# Patient Record
Sex: Male | Born: 1960 | Race: White | Hispanic: No | Smoking: Current every day smoker
Health system: Southern US, Community
[De-identification: ages and names within clinical notes are randomized; demographics above are authoritative.]

## PROBLEM LIST (undated history)

## (undated) DIAGNOSIS — F419 Anxiety disorder, unspecified: Secondary | ICD-10-CM

## (undated) DIAGNOSIS — F4310 Post-traumatic stress disorder, unspecified: Secondary | ICD-10-CM

## (undated) DIAGNOSIS — H9319 Tinnitus, unspecified ear: Secondary | ICD-10-CM

## (undated) DIAGNOSIS — I10 Essential (primary) hypertension: Secondary | ICD-10-CM

## (undated) DIAGNOSIS — F329 Major depressive disorder, single episode, unspecified: Secondary | ICD-10-CM

## (undated) DIAGNOSIS — R002 Palpitations: Secondary | ICD-10-CM

## (undated) DIAGNOSIS — J449 Chronic obstructive pulmonary disease, unspecified: Secondary | ICD-10-CM

## (undated) DIAGNOSIS — I341 Nonrheumatic mitral (valve) prolapse: Secondary | ICD-10-CM

## (undated) DIAGNOSIS — F32A Depression, unspecified: Secondary | ICD-10-CM

## (undated) DIAGNOSIS — F1910 Other psychoactive substance abuse, uncomplicated: Secondary | ICD-10-CM

## (undated) DIAGNOSIS — F319 Bipolar disorder, unspecified: Secondary | ICD-10-CM

## (undated) HISTORY — DX: Major depressive disorder, single episode, unspecified: F32.9

## (undated) HISTORY — DX: Anxiety disorder, unspecified: F41.9

## (undated) HISTORY — DX: Palpitations: R00.2

## (undated) HISTORY — DX: Essential (primary) hypertension: I10

## (undated) HISTORY — DX: Other psychoactive substance abuse, uncomplicated: F19.10

## (undated) HISTORY — DX: Bipolar disorder, unspecified: F31.9

## (undated) HISTORY — DX: Post-traumatic stress disorder, unspecified: F43.10

## (undated) HISTORY — PX: SALIVARY GLAND SURGERY: SHX768

## (undated) HISTORY — DX: Chronic obstructive pulmonary disease, unspecified: J44.9

## (undated) HISTORY — DX: Depression, unspecified: F32.A

## (undated) HISTORY — DX: Tinnitus, unspecified ear: H93.19

## (undated) HISTORY — DX: Nonrheumatic mitral (valve) prolapse: I34.1

## (undated) HISTORY — PX: OTHER SURGICAL HISTORY: SHX169

---

## 1999-09-27 ENCOUNTER — Emergency Department (HOSPITAL_COMMUNITY): Admission: EM | Admit: 1999-09-27 | Discharge: 1999-09-27 | Payer: Self-pay | Admitting: Emergency Medicine

## 1999-09-27 ENCOUNTER — Encounter: Payer: Self-pay | Admitting: Emergency Medicine

## 2003-10-06 DIAGNOSIS — I341 Nonrheumatic mitral (valve) prolapse: Secondary | ICD-10-CM

## 2003-10-06 HISTORY — DX: Nonrheumatic mitral (valve) prolapse: I34.1

## 2007-04-25 ENCOUNTER — Emergency Department (HOSPITAL_COMMUNITY): Admission: EM | Admit: 2007-04-25 | Discharge: 2007-04-26 | Payer: Self-pay | Admitting: Emergency Medicine

## 2007-04-26 ENCOUNTER — Inpatient Hospital Stay (HOSPITAL_COMMUNITY): Admission: AD | Admit: 2007-04-26 | Discharge: 2007-04-28 | Payer: Self-pay | Admitting: *Deleted

## 2007-04-26 ENCOUNTER — Ambulatory Visit: Payer: Self-pay | Admitting: *Deleted

## 2011-02-17 NOTE — Discharge Summary (Signed)
NAMEHUMBERT, Lucas Lawrence NO.:  [id number]   MEDICAL RECORD NO.:  [id number]          PATIENT TYPE:  IPS   LOCATION:  0304                          FACILITY:  [facility]   PHYSICIAN:  Jasmine Pang, M.D. DATE OF BIRTH:  1960/10/26   DATE OF ADMISSION:  04/26/2007  DATE OF DISCHARGE:  04/28/2007                               DISCHARGE SUMMARY   IDENTIFYING INFORMATION:  This is a 50 year old white, married male who  is currently separated from his wife.  He was admitted on a voluntary  basis on April 25, 2007.   HISTORY OF PRESENT ILLNESS:  The patient relapsed on alcohol after about  6 months abstinence.  He relapsed 2 weeks ago.  He is now drinking  vodka, approximately one-fifth a day.  He states he is needing to drink  to stay calm.  He is now wanting detox.  He and his wife separated 1  week ago secondary to his alcohol use, so he plans to live with his  sister.  This is the first inpatient admission for this patient.  He is  in no outpatient treatment.  He did have a DWI 7 or 8 years ago and got  some counseling then.  He has a family history of substance dependence,  especially in father and paternal grandfather.  He has hypertension.  He  is currently on Xanax 0.5 mg p.o. b.i.d.  He has no known drug  allergies.   PHYSICAL FINDINGS:  A complete physical exam was done in the ED prior to  admission.  This was within normal limits.  He was in no acute physical  or medical distress.   ADMISSION LABORATORIES:  UDS:  Positive for benzodiazepines.  Potassium  slightly low at 3.3.  Alcohol level 254.  The rest of the labs were done  at the ED prior to admission and reviewed by the ED physician.   HOSPITAL COURSE:  Upon admission, the patient was started on Librium  detox protocol.  He was also started on trazodone 50 mg p.o. q.h.s.  p.r.n. insomnia, may repeat in 1 hour if needed.  He was continued on  his home medication of BuSpar 15 mg one-half tablet p.o. b.i.d.,  Zoloft  100 mg p.o. b.i.d., atenolol 100 mg p.o. b.i.d., lovastatin 40 mg p.o.  q.h.s.  On April 26, 2007, the BuSpar was discontinued as he did not feel  this had been helpful.  The patient tolerated these medications well  with no significant side effects.  He tolerated the Librium detox  protocol without symptoms of withdrawal.   The patient was friendly and cooperative.  He stated I am an  alcoholic.  Though he stated initially he wanted treatment for this, he  later decided he wanted an outpatient rehab treatment.  He states he  began to drink vodka to stay calm.  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  He was drinking some during the day.  He has a  history of a DWI.  He has been at [facility] in the past.  He participated  appropriately in unit  therapeutic groups and activities.  His mood and  affect began to improve.  He was anxious about being in the hospital and  wanted to go home as soon as he could.  As indicated above, he has  decided he did not want followup with a psychiatrist or counselor or any  drug treatment followup.  He planned to go to his church and be  counseled by his pastor.  He also talked about a program the church had  for which there were meetings every week for chemically dependent people  that he was going to attend.   On April 28, 2007, the patient's mental status had improved markedly from  admission status.  He was friendly and cooperative with good eye  contact.  Speech was normal rate and flow.  Psychomotor activity was  within normal limits.  The mood was euthymic.  Affect was wide range.  There was no suicidal or homicidal ideation.  No thoughts of self-  injurious behavior.  No paranoia or delusions.  Thoughts were logical  and goal-directed.  Thought content with no predominant theme.  Cognitive was grossly back to baseline, which was within normal limits.  It was felt the patient could safely be discharged home today.   DISCHARGE  DIAGNOSES:  AXIS I:  Alcohol dependence.  Rule out depressive disorder, not otherwise specified.  AXIS II:  None.  AXIS III:  Hypertension.  AXIS IV:  Severe (issues with marital separation, problems with social  environment, housing problem, burden of chemical dependence history).  AXIS V:  Global assessment of functioning upon discharge is 50.  Global  assessment of functioning upon admission was 35.  Global assessment of  functioning highest past year was 70.   DISCHARGE PLANS:  There were no specific activity level or dietary  restrictions.   POST HOSPITAL CARE PLANS:  The patient refuses followup and resources  for any post hospital treatment.   DISCHARGE MEDICATIONS:  1. Tenormin 100 mg b.i.d.  2. Lovastatin 40 mg daily.  3. Zoloft 100 mg twice daily.  4. Trazodone 50 mg at bedtime if needed for sleep, may repeat dose x1      if needed.      Jasmine Pang, M.D.  Electronically Signed     [facility]  D:  04/28/2007  T:  04/29/2007  Job:  [id number]

## 2011-07-20 LAB — BASIC METABOLIC PANEL
Chloride: 98
GFR calc Af Amer: >60
GFR calc non Af Amer: >60
Potassium: 3.3 — ABNORMAL LOW

## 2011-07-20 LAB — RAPID URINE DRUG SCREEN, HOSP PERFORMED
Amphetamines: NOT DETECTED
Barbiturates: NOT DETECTED
Benzodiazepines: POSITIVE — AB
Opiates: NOT DETECTED

## 2011-07-20 LAB — HEPATIC FUNCTION PANEL
AST: 49 — ABNORMAL HIGH
Alkaline Phosphatase: 95
Bilirubin, Direct: 0.2
Total Bilirubin: 1.4 — ABNORMAL HIGH

## 2011-07-20 LAB — ETHANOL: Alcohol, Ethyl (B): 254 — ABNORMAL HIGH

## 2011-07-20 LAB — TSH: TSH: 3.144

## 2012-10-17 ENCOUNTER — Emergency Department (HOSPITAL_COMMUNITY)
Admission: EM | Admit: 2012-10-17 | Discharge: 2012-10-17 | Disposition: A | Discharge status: Home / Self Care | Payer: Self-pay | Attending: Emergency Medicine | Admitting: Emergency Medicine

## 2012-10-17 ENCOUNTER — Encounter (HOSPITAL_COMMUNITY): Payer: Self-pay | Admitting: *Deleted

## 2012-10-17 ENCOUNTER — Emergency Department (HOSPITAL_COMMUNITY): Payer: Self-pay

## 2012-10-17 DIAGNOSIS — F17200 Nicotine dependence, unspecified, uncomplicated: Secondary | ICD-10-CM | POA: Insufficient documentation

## 2012-10-17 DIAGNOSIS — Z7982 Long term (current) use of aspirin: Secondary | ICD-10-CM | POA: Insufficient documentation

## 2012-10-17 DIAGNOSIS — Y9301 Activity, walking, marching and hiking: Secondary | ICD-10-CM | POA: Insufficient documentation

## 2012-10-17 DIAGNOSIS — T148XXA Other injury of unspecified body region, initial encounter: Secondary | ICD-10-CM

## 2012-10-17 DIAGNOSIS — I10 Essential (primary) hypertension: Secondary | ICD-10-CM | POA: Insufficient documentation

## 2012-10-17 DIAGNOSIS — Z8679 Personal history of other diseases of the circulatory system: Secondary | ICD-10-CM | POA: Insufficient documentation

## 2012-10-17 DIAGNOSIS — Z79899 Other long term (current) drug therapy: Secondary | ICD-10-CM | POA: Insufficient documentation

## 2012-10-17 DIAGNOSIS — Y929 Unspecified place or not applicable: Secondary | ICD-10-CM | POA: Insufficient documentation

## 2012-10-17 DIAGNOSIS — X500XXA Overexertion from strenuous movement or load, initial encounter: Secondary | ICD-10-CM | POA: Insufficient documentation

## 2012-10-17 DIAGNOSIS — IMO0002 Reserved for concepts with insufficient information to code with codable children: Secondary | ICD-10-CM | POA: Insufficient documentation

## 2012-10-17 HISTORY — DX: Essential (primary) hypertension: I10

## 2012-10-17 HISTORY — DX: Nonrheumatic mitral (valve) prolapse: I34.1

## 2012-10-17 MED ORDER — IBUPROFEN 800 MG PO TABS: 800.0000 mg | ORAL_TABLET | Freq: Three times a day (TID) | ORAL | Status: AC | PRN

## 2012-10-17 MED ORDER — OXYCODONE-ACETAMINOPHEN 5-325 MG PO TABS: 1.0000 | ORAL_TABLET | Freq: Four times a day (QID) | ORAL | Status: AC | PRN

## 2012-10-17 NOTE — ED Notes (Signed)
Pt is here with left mid shin pain that started after he turned and felt something pop in his left shin.  Pt cannot put weight on it.  Pulse present.  Some mid shin swelling

## 2012-10-17 NOTE — ED Provider Notes (Signed)
History     CSN: [id number]  Arrival date & time 10/17/12  0719   First MD Initiated Contact with Patient 10/17/12 [phone]      Chief Complaint  Patient presents with  . Leg Pain    (Consider location/radiation/quality/duration/timing/severity/associated sxs/prior treatment) HPI Pt reports several days of aching pain in L ankle, yesterday when he was turning to walk, he felt a pop in his anterior shin associated with severe pain, unable to bear weight due to pain. No knee pain. No previous history of same.   Past Medical History  Diagnosis Date  . Hypertension   . Mitral valve prolapse     Past Surgical History  Procedure Date  . Neck surgery gland removal     No family history on file.  History  Substance Use Topics  . Smoking status: Current Every Day Smoker  . Smokeless tobacco: Not on file  . Alcohol Use: Yes     Comment: occ      Review of Systems All other systems reviewed and are negative except as noted in HPI.   Allergies  Review of patient's allergies indicates no known allergies.  Home Medications   Current Outpatient Rx  Name  Route  Sig  Dispense  Refill  . ASPIRIN EC 81 MG PO TBEC   Oral   Take 81 mg by mouth daily.         . ATENOLOL 50 MG PO TABS   Oral   Take 50 mg by mouth 2 (two) times daily.         Marland Kitchen CLONAZEPAM 1 MG PO TABS   Oral   Take 1 mg by mouth 3 (three) times daily.         . IBUPROFEN 200 MG PO TABS   Oral   Take 800 mg by mouth every 6 (six) hours as needed. For pain         . ADULT MULTIVITAMIN W/MINERALS CH   Oral   Take 1 tablet by mouth at bedtime.           BP 146/76  Pulse 58  Temp 98.3 F (36.8 C) (Oral)  Resp 16  SpO2 98%  Physical Exam  Constitutional: He is oriented to person, place, and time. He appears well-developed and well-nourished.  HENT:  Head: Normocephalic and atraumatic.  Neck: Neck supple.  Pulmonary/Chest: Effort normal.  Musculoskeletal: Normal range of motion. He  exhibits tenderness (mild L anterior shin, no calf pain/tenderness, no cords). He exhibits no edema.  Neurological: He is alert and oriented to person, place, and time. No cranial nerve deficit.  Psychiatric: He has a normal mood and affect. His behavior is normal.    ED Course  Procedures (including critical care time)  Labs Reviewed - No data to display Dg Tibia/fibula Left  10/17/2012  *RADIOLOGY REPORT*  Clinical Data: Felt "pop" in lower leg.  Unable to bear weight.  LEFT TIBIA AND FIBULA - 2 VIEW  Comparison: None.  Findings: There may be mild soft tissue swelling about the ankle joint.  No definite fracture.  Tibia and fibula are intact.  IMPRESSION: Possible mild soft tissue swelling about the ankle joint.  No acute osseous abnormality.   Original Report Authenticated By: Leanna Battles, M.D.      No diagnosis found.    MDM  Likely soft tissue injury to anterior shin, xray as above neg for fracture. Cam walker, crutches, pain meds and PCP followup.  Cordell B. Bernette Mayers, MD 10/17/12 [phone]

## 2012-10-17 NOTE — Progress Notes (Signed)
Orthopedic Tech Progress Note Patient Details:  Lucas Lawrence [birth year] [id number] CAM walker applied to Left LE with instructions. Crutches fitted for height and comfort.  Ortho Devices Type of Ortho Device: CAM walker;Crutches Ortho Device/Splint Location: Left LE Ortho Device/Splint Interventions: Application   Asia R Thompson 10/17/2012, 8:53 AM

## 2016-01-09 DIAGNOSIS — I10 Essential (primary) hypertension: Secondary | ICD-10-CM

## 2016-01-15 ENCOUNTER — Encounter: Payer: Self-pay | Admitting: Physician Assistant

## 2016-01-15 ENCOUNTER — Ambulatory Visit: Payer: Self-pay | Admitting: Physician Assistant

## 2016-01-15 VITALS — BP 104/62 | HR 57 | Temp 97.9°F | Ht 68.0 in | Wt 175.4 lb

## 2016-01-15 DIAGNOSIS — F17219 Nicotine dependence, cigarettes, with unspecified nicotine-induced disorders: Secondary | ICD-10-CM | POA: Insufficient documentation

## 2016-01-15 DIAGNOSIS — F1011 Alcohol abuse, in remission: Secondary | ICD-10-CM | POA: Insufficient documentation

## 2016-01-15 DIAGNOSIS — M2550 Pain in unspecified joint: Secondary | ICD-10-CM

## 2016-01-15 DIAGNOSIS — R351 Nocturia: Secondary | ICD-10-CM

## 2016-01-15 DIAGNOSIS — F3160 Bipolar disorder, current episode mixed, unspecified: Secondary | ICD-10-CM | POA: Insufficient documentation

## 2016-01-15 DIAGNOSIS — Z131 Encounter for screening for diabetes mellitus: Secondary | ICD-10-CM

## 2016-01-15 DIAGNOSIS — Z125 Encounter for screening for malignant neoplasm of prostate: Secondary | ICD-10-CM

## 2016-01-15 DIAGNOSIS — Z13220 Encounter for screening for lipoid disorders: Secondary | ICD-10-CM

## 2016-01-15 LAB — POCT URINALYSIS DIPSTICK
Bilirubin, UA: NEGATIVE
Glucose, UA: NEGATIVE
Ketones, UA: NEGATIVE
Leukocytes, UA: NEGATIVE
Nitrite, UA: NEGATIVE
PROTEIN UA: NEGATIVE
RBC UA: NEGATIVE
SPEC GRAV UA: 1.025
UROBILINOGEN UA: 0.2
pH, UA: 5.5

## 2016-01-15 LAB — GLUCOSE, POCT (MANUAL RESULT ENTRY): POC Glucose: 110 mg/dl — AB (ref 70–99)

## 2016-01-15 NOTE — Progress Notes (Signed)
BP 104/62 mmHg  Pulse 57  Temp(Src) 97.9 F (36.6 C)  Ht  (1.727 m)  Wt 175 lb 6.4 oz (79.561 kg)  BMI 26.68 kg/m2  SpO2 95%   Subjective:    Patient ID: Lucas Lawrence, male    DOB: 1961/08/20, 55 y.o.   MRN: [id number]  HPI: Lucas Lawrence is a 55 y.o. male presenting on 01/15/2016 for New Patient (Initial Visit); Shoulder Pain; Elbow Pain; Edema; Urinary Frequency; and Numbness   HPI   Chief Complaint  Patient presents with  . New Patient (Initial Visit)  . Shoulder Pain    R shoulder pain when raising R arm. denies injury  . Elbow Pain    L elbow. denies injury  . Edema    R calf down to ankle and sometimes R foot  . Urinary Frequency    pt states he wakes up every 2 hours to urinate during the night for about 2 months now  . Numbness    on 4th and 5th fingers on the R hand down to his wrist     Pt just moved here from [place] about one month ago.   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   Pt alcoholic. Dry since march.  No counseling at present  Pt went to [facility].  He isn't goin back b/c he didn't like who he saw.  Pt taking azo b/c of nocturia. This started within past month.  Pt states he has not had an injury, remote or recent, he just has lots of aches and pain.  Pt states he had colonoscopy about 1 1/2 yr ago  Relevant past medical, surgical, family and social history reviewed and updated as indicated. Interim medical history since our last visit reviewed. Allergies and medications reviewed and updated.   Current outpatient prescriptions:  .  albuterol (PROVENTIL HFA) 108 (90 Base) MCG/ACT inhaler, Inhale 2 puffs into the lungs every 4 (four) hours as needed for wheezing or shortness of breath., Disp: , Rfl:  .  aspirin 81 MG tablet, Take 81 mg by mouth daily., Disp: , Rfl:  .  gabapentin (NEURONTIN) 400 MG capsule, Take 400 mg by mouth. Take 1 cap every morning, 1 in the afternoon, and 2 each night, Disp: , Rfl:  .   ibuprofen (ADVIL,MOTRIN) 200 MG tablet, Take 800 mg by mouth as needed., Disp: , Rfl:  .  metoprolol (LOPRESSOR) 50 MG tablet, Take 50 mg by mouth 2 (two) times daily., Disp: , Rfl:  .  Omega-3 Fatty Acids (FISH OIL) 1200 MG CAPS, Take 1 capsule by mouth 2 (two) times daily., Disp: , Rfl:  .  Phenazopyridine HCl (AZO TABS PO), Take by mouth as needed., Disp: , Rfl:  .  amLODipine (NORVASC) 5 MG tablet, Take 5 mg by mouth daily., Disp: , Rfl:  .  citalopram (CELEXA) 40 MG tablet, Take 40 mg by mouth daily., Disp: , Rfl:  .  clonazePAM (KLONOPIN) 0.5 MG tablet, Take 0.5 mg by mouth 2 (two) times daily. Reported on 01/15/2016, Disp: , Rfl:  .  mometasone-formoterol (DULERA) 100-5 MCG/ACT AERO, Inhale 2 puffs into the lungs 2 (two) times daily. Reported on 01/15/2016, Disp: , Rfl:  Not taking:  Amlodipine, celexa, clonazepam, dulera  Review of Systems  Constitutional: Negative for fever, chills, diaphoresis, appetite change, fatigue and unexpected weight change.  HENT: Positive for dental problem, hearing loss and sore throat. Negative for congestion, drooling, ear pain, facial swelling, mouth  sores, sneezing, trouble swallowing and voice change.   Eyes: Negative for pain, discharge, redness, itching and visual disturbance.  Respiratory: Negative for cough, choking, shortness of breath and wheezing.   Cardiovascular: Positive for palpitations and leg swelling. Negative for chest pain.  Gastrointestinal: Positive for abdominal pain. Negative for vomiting, diarrhea, constipation and blood in stool.  Endocrine: Negative for cold intolerance, heat intolerance and polydipsia.  Genitourinary: Negative for dysuria, hematuria and decreased urine volume.  Musculoskeletal: Positive for arthralgias. Negative for back pain and gait problem.  Skin: Negative for rash.  Allergic/Immunologic: Positive for environmental allergies.  Neurological: Positive for light-headedness and headaches. Negative for seizures and  syncope.  Hematological: Negative for adenopathy.  Psychiatric/Behavioral: Positive for dysphoric mood. Negative for suicidal ideas and agitation. The patient is nervous/anxious.     Per HPI unless specifically indicated above     Objective:    BP 104/62 mmHg  Pulse 57  Temp(Src) 97.9 F (36.6 C)  Ht 5\' 8"  (1.727 m)  Wt 175 lb 6.4 oz (79.561 kg)  BMI 26.68 kg/m2  SpO2 95%  Wt Readings from Last 3 Encounters:  01/15/16 175 lb 6.4 oz (79.561 kg)    Physical Exam  Constitutional: He is oriented to person, place, and time. He appears well-developed and well-nourished.  HENT:  Head: Normocephalic and atraumatic.  Mouth/Throat: Oropharynx is clear and moist. No oropharyngeal exudate.  Eyes: Conjunctivae and EOM are normal. Pupils are equal, round, and reactive to light.  Neck: Neck supple. No thyromegaly present.  Cardiovascular: Normal rate and regular rhythm.   Pulmonary/Chest: Effort normal and breath sounds normal. He has no wheezes. He has no rales.  Abdominal: Soft. Bowel sounds are normal. He exhibits no mass. There is no hepatosplenomegaly. There is no tenderness.  Musculoskeletal: He exhibits no edema (no edema seen).  Lymphadenopathy:    He has no cervical adenopathy.  Neurological: He is alert and oriented to person, place, and time.  Skin: Skin is warm and dry. No rash noted.  Psychiatric: He has a normal mood and affect. His behavior is normal. Thought content normal.  Vitals reviewed.   Results for orders placed or performed in visit on 01/15/16  POCT Glucose (CBG)  Result Value Ref Range   POC Glucose 110 (A) 70 - 99 mg/dl   Urinalysis    Component Value Date/Time   BILIRUBINUR N 01/15/2016 1039   PROTEINUR N 01/15/2016 1039   UROBILINOGEN 0.2 01/15/2016 1039   NITRITE N 01/15/2016 1039   LEUKOCYTESUR Negative 01/15/2016 1039         Assessment & Plan:   Encounter Diagnoses  Name Primary?  Marland Kitchen. Arthralgia of multiple sites Yes  . Nocturia   .  History of alcohol abuse   . Cigarette nicotine dependence with nicotine-induced disorder   . Screening for diabetes mellitus   . Screening for prostate cancer   . Screening cholesterol level   . Bipolar affective disorder, current episode mixed, current episode severity unspecified (HCC)    -get Baseline labs -Requested colonoscopy report -Gave cardinal card- pt to call for MH appt at facility not [facility] -F/u 1 month (spent 40 minutes with pt)

## 2016-01-16 LAB — CBC WITH DIFFERENTIAL/PLATELET
BASOS ABS: 0 {cells}/uL (ref 0–200)
BASOS PCT: 0 %
EOS ABS: 276 {cells}/uL (ref 15–500)
Eosinophils Relative: 4 %
HEMATOCRIT: 40.9 % (ref 38.5–50.0)
HEMOGLOBIN: 13.8 g/dL (ref 13.2–17.1)
LYMPHS ABS: 2208 {cells}/uL (ref 850–3900)
Lymphocytes Relative: 32 %
MCH: 31.3 pg (ref 27.0–33.0)
MCHC: 33.7 g/dL (ref 32.0–36.0)
MCV: 92.7 fL (ref 80.0–100.0)
MONO ABS: 759 {cells}/uL (ref 200–950)
MPV: 9.9 fL (ref 7.5–12.5)
Monocytes Relative: 11 %
NEUTROS ABS: 3657 {cells}/uL (ref 1500–7800)
Neutrophils Relative %: 53 %
Platelets: 253 10*3/uL (ref 140–400)
RBC: 4.41 MIL/uL (ref 4.20–5.80)
RDW: 13.6 % (ref 11.0–15.0)
WBC: 6.9 10*3/uL (ref 3.8–10.8)

## 2016-01-16 LAB — COMPLETE METABOLIC PANEL WITH GFR
ALT: 30 U/L (ref 9–46)
AST: 21 U/L (ref 10–35)
Albumin: 4 g/dL (ref 3.6–5.1)
Alkaline Phosphatase: 73 U/L (ref 40–115)
BILIRUBIN TOTAL: 0.3 mg/dL (ref 0.2–1.2)
BUN: 14 mg/dL (ref 7–25)
CHLORIDE: 106 mmol/L (ref 98–110)
CO2: 24 mmol/L (ref 20–31)
Calcium: 8.4 mg/dL — ABNORMAL LOW (ref 8.6–10.3)
Creat: 0.9 mg/dL (ref 0.70–1.33)
GFR, Est African American: >89 mL/min (ref >=60)
GLUCOSE: 91 mg/dL (ref 65–99)
POTASSIUM: 4.6 mmol/L (ref 3.5–5.3)
SODIUM: 141 mmol/L (ref 135–146)
TOTAL PROTEIN: 5.9 g/dL — AB (ref 6.1–8.1)

## 2016-01-16 LAB — SEDIMENTATION RATE: Sed Rate: 1 mm/hr (ref 0–20)

## 2016-01-16 LAB — TSH: TSH: 1.91 mIU/L (ref 0.40–4.50)

## 2016-01-17 LAB — HEPATITIS PANEL, ACUTE
HCV AB: NEGATIVE
HEP A IGM: NONREACTIVE
Hep B C IgM: NONREACTIVE
Hepatitis B Surface Ag: NEGATIVE

## 2016-01-17 LAB — HEMOGLOBIN A1C
Hgb A1c MFr Bld: 5.5 % (ref <5.7)
MEAN PLASMA GLUCOSE: 111 mg/dL

## 2016-01-17 LAB — PSA: PSA: 0.34 ng/mL (ref <=4.00)

## 2016-01-30 ENCOUNTER — Encounter: Payer: Self-pay | Admitting: Physician Assistant

## 2016-01-31 ENCOUNTER — Emergency Department (HOSPITAL_COMMUNITY)
Admission: EM | Admit: 2016-01-31 | Discharge: 2016-01-31 | Disposition: A | Discharge status: Home / Self Care | Payer: Self-pay | Attending: Emergency Medicine | Admitting: Emergency Medicine

## 2016-01-31 ENCOUNTER — Encounter (HOSPITAL_COMMUNITY): Payer: Self-pay

## 2016-01-31 DIAGNOSIS — Z79899 Other long term (current) drug therapy: Secondary | ICD-10-CM | POA: Insufficient documentation

## 2016-01-31 DIAGNOSIS — F329 Major depressive disorder, single episode, unspecified: Secondary | ICD-10-CM | POA: Insufficient documentation

## 2016-01-31 DIAGNOSIS — Z791 Long term (current) use of non-steroidal anti-inflammatories (NSAID): Secondary | ICD-10-CM | POA: Insufficient documentation

## 2016-01-31 DIAGNOSIS — J449 Chronic obstructive pulmonary disease, unspecified: Secondary | ICD-10-CM | POA: Insufficient documentation

## 2016-01-31 DIAGNOSIS — Z7982 Long term (current) use of aspirin: Secondary | ICD-10-CM | POA: Insufficient documentation

## 2016-01-31 DIAGNOSIS — F17210 Nicotine dependence, cigarettes, uncomplicated: Secondary | ICD-10-CM | POA: Insufficient documentation

## 2016-01-31 DIAGNOSIS — F419 Anxiety disorder, unspecified: Secondary | ICD-10-CM | POA: Insufficient documentation

## 2016-01-31 DIAGNOSIS — I10 Essential (primary) hypertension: Secondary | ICD-10-CM | POA: Insufficient documentation

## 2016-01-31 MED ORDER — DIAZEPAM 5 MG PO TABS: 5.0000 mg | ORAL_TABLET | Freq: Three times a day (TID) | ORAL | Status: DC | PRN

## 2016-01-31 NOTE — ED Notes (Signed)
Pt reports ran out of valium last weekend and can't [facility] until May 22.  Pt says has been having severe anxiety and panic attacks.  Denies SI or HI.

## 2016-01-31 NOTE — ED Notes (Signed)
Pt states understands care given and follow up instructions.  Ambulated from ED.  Stressed importance of keeping May 22 appointment

## 2016-01-31 NOTE — ED Provider Notes (Signed)
CSN: [id number]     Arrival date & time 01/31/16  1715 History   First MD Initiated Contact with Patient 01/31/16 1754     Chief Complaint  Patient presents with  . Anxiety     (Consider location/radiation/quality/duration/timing/severity/associated sxs/prior Treatment) HPI   Lucas Lawrence is a 55 y.o. male who presents for evaluation of the distal withdrawal, from benzodiazepine cessation. He ran out of his Valium, one week ago, and since then has noted increasing sensation of tremor, and mild panic attacks. He states that he is in between prescribing physicians, and does not have a new appointment until 02/23/2017. States that he missed an appointment with his prior physician, yesterday, and that was due to a "miscarriage medication". Because of the missed appointment, the physician has refused to see him again unless he goes through the entire initiation process. Patient is excited about starting new job, and 3 days time. He denies recent fever, chills, cough, shortness of breath, chest pain, weakness or dizziness. He denies alcohol or illegal drugs. There are no other no modifying factors.     Past Medical History  Diagnosis Date  . Substance abuse   . PTSD (post-traumatic stress disorder)   . Bipolar disorder (HCC)   . Depression   . Anxiety   . Hypertension   . Mitral valve prolapse 2005  . Heart palpitations   . COPD (chronic obstructive pulmonary disease) (HCC)   . Tinnitus Bilateral   Past Surgical History  Procedure Laterality Date  . Salivary gland surgery Left     Salivary Gland removal   Family History  Problem Relation Age of Onset  . Mental illness Mother     Bipolar  . Dementia Mother   . COPD Mother   . COPD Father   . Lung disease Father    Social History  Substance Use Topics  . Smoking status: Current Every Day Smoker -- 1.00 packs/day for 30 years    Types: Cigarettes  . Smokeless tobacco: Former NeurosurgeonUser    Types: Chew  . Alcohol Use: No   Comment: alcoholic. dry since 12/2015    Review of Systems  All other systems reviewed and are negative.     Allergies  Review of patient's allergies indicates no known allergies.  Home Medications   Prior to Admission medications   Medication Sig Start Date End Date Taking? Authorizing Provider  albuterol (PROVENTIL HFA) 108 (90 Base) MCG/ACT inhaler Inhale 2 puffs into the lungs every 4 (four) hours as needed for wheezing or shortness of breath.   Yes Historical Provider, MD  aspirin 81 MG tablet Take 81 mg by mouth daily.   Yes Historical Provider, MD  ibuprofen (ADVIL,MOTRIN) 200 MG tablet Take 800 mg by mouth daily as needed for headache, mild pain or moderate pain.    Yes Historical Provider, MD  metoprolol (LOPRESSOR) 50 MG tablet Take 50 mg by mouth 2 (two) times daily.   Yes Historical Provider, MD  mometasone-formoterol (DULERA) 100-5 MCG/ACT AERO Inhale 2 puffs into the lungs 2 (two) times daily as needed for wheezing or shortness of breath. Reported on 01/15/2016   Yes Historical Provider, MD  Omega-3 Fatty Acids (FISH OIL) 1200 MG CAPS Take 1 capsule by mouth 2 (two) times daily.   Yes Historical Provider, MD  diazepam (VALIUM) 5 MG tablet Take 1 tablet (5 mg total) by mouth every 8 (eight) hours as needed for anxiety. 01/31/16   Mancel BaleElliott Jerman Tinnon, MD   BP 125/74 mmHg  Pulse 52  Temp(Src) 98.7 F (37.1 C) (Oral)  Resp 19  Ht  (1.727 m)  Wt 175 lb (79.379 kg)  BMI 26.61 kg/m2  SpO2 96% Physical Exam  Constitutional: He is oriented to person, place, and time. He appears well-developed and well-nourished. No distress.  HENT:  Head: Normocephalic and atraumatic.  Right Ear: External ear normal.  Left Ear: External ear normal.  Eyes: Conjunctivae and EOM are normal. Pupils are equal, round, and reactive to light.  Neck: Normal range of motion and phonation normal. Neck supple.  Cardiovascular: Normal rate, regular rhythm and normal heart sounds.   Pulmonary/Chest:  Effort normal and breath sounds normal. He exhibits no bony tenderness.  Abdominal: Soft. There is no tenderness.  Musculoskeletal: Normal range of motion.  Neurological: He is alert and oriented to person, place, and time. No cranial nerve deficit or sensory deficit. He exhibits normal muscle tone. Coordination normal.  Alert, lucid, cooperative.  Skin: Skin is warm, dry and intact.  Psychiatric: He has a normal mood and affect. His behavior is normal. Judgment and thought content normal.  Nursing note and vitals reviewed.   ED Course  Procedures (including critical care time) Medications - No data to display  Patient Vitals for the past 24 hrs:  BP Temp Temp src Pulse Resp SpO2 Height Weight  01/31/16 1912 125/74 mmHg 98.7 F (37.1 C) Oral (!) 52 19 96 % - -  01/31/16 1732 139/70 mmHg 98.8 F (37.1 C) Oral 63 20 100 %  (1.727 m) 175 lb (79.379 kg)    7:35 PM Reevaluation with update and discussion. After initial assessment and treatment, an updated evaluation reveals he continues to be appropriate and cooperative. Findings discussed with patient, all questions were answered. Devann Cribb L     Labs Review Labs Reviewed - No data to display  Imaging Review No results found. I have personally reviewed and evaluated these images and lab results as part of my medical decision-making.   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 He is at high risk for decompensation, and seems to be appropriate seeking intervention. We'll give him the benefit of the doubt, and bridge therapy until he can see his new provider.   Nursing Notes Reviewed/ Care Coordinated Applicable Imaging Reviewed Interpretation of Laboratory Data incorporated into ED treatment  The patient appears reasonably screened and/or stabilized for discharge and I doubt any other medical condition or other [facility] requiring  further screening, evaluation, or treatment in the ED at this time prior to discharge.  Plan: Home Medications- Valium; Home Treatments- rest; return here if the recommended treatment, does not improve the symptoms; Recommended follow up- as scheduled   Mancel Bale, MD 01/31/16 1940

## 2016-01-31 NOTE — Discharge Instructions (Signed)
Generalized Anxiety Disorder Generalized anxiety disorder (GAD) is a mental disorder. It interferes with life functions, including relationships, work, and school. GAD is different from normal anxiety, which everyone experiences at some point in their lives in response to specific life events and activities. Normal anxiety actually helps us prepare for and get through these life events and activities. Normal anxiety goes away after the event or activity is over.  GAD causes anxiety that is not necessarily related to specific events or activities. It also causes excess anxiety in proportion to specific events or activities. The anxiety associated with GAD is also difficult to control. GAD can vary from mild to severe. People with severe GAD can have intense waves of anxiety with physical symptoms (panic attacks).  SYMPTOMS The anxiety and worry associated with GAD are difficult to control. This anxiety and worry are related to many life events and activities and also occur more days than not for 6 months or longer. People with GAD also have three or more of the following symptoms (one or more in children):  Restlessness.   Fatigue.  Difficulty concentrating.   Irritability.  Muscle tension.  Difficulty sleeping or unsatisfying sleep. DIAGNOSIS GAD is diagnosed through an assessment by your health care provider. Your health care provider will ask you questions aboutyour mood,physical symptoms, and events in your life. Your health care provider may ask you about your medical history and use of alcohol or drugs, including prescription medicines. Your health care provider may also do a physical exam and blood tests. Certain medical conditions and the use of certain substances can cause symptoms similar to those associated with GAD. Your health care provider may refer you to a mental health specialist for further evaluation. TREATMENT The following therapies are usually used to treat GAD:    Medication. Antidepressant medication usually is prescribed for long-term daily control. Antianxiety medicines may be added in severe cases, especially when panic attacks occur.   Talk therapy (psychotherapy). Certain types of talk therapy can be helpful in treating GAD by providing support, education, and guidance. A form of talk therapy called cognitive behavioral therapy can teach you healthy ways to think about and react to daily life events and activities.  Stress managementtechniques. These include yoga, meditation, and exercise and can be very helpful when they are practiced regularly. A mental health specialist can help determine which treatment is best for you. Some people see improvement with one therapy. However, other people require a combination of therapies.   This information is not intended to replace advice given to you by your health care provider. Make sure you discuss any questions you have with your health care provider.   Document Released: 01/16/2013 Document Revised: 10/12/2014 Document Reviewed: 01/16/2013 Elsevier Interactive Patient Education 2016 [employer] Inc.  

## 2016-02-13 ENCOUNTER — Ambulatory Visit: Payer: Self-pay | Admitting: Physician Assistant

## 2016-02-20 NOTE — Congregational Nurse Program (Signed)
Congregational Nurse Program Note  Date of Encounter: 01/09/2016  Past Medical History: Past Medical History  Diagnosis Date  . Substance abuse   . PTSD (post-traumatic stress disorder)   . Bipolar disorder (HCC)   . Depression   . Anxiety   . Hypertension   . Mitral valve prolapse 2005  . Heart palpitations   . COPD (chronic obstructive pulmonary disease) (HCC)   . Tinnitus Bilateral    Encounter Details:  [facility]. [facility] for [facility] [place] sister [place]. He receives food stamps , but has no income nor insurance.  PMH: Removal of left saliva gland due to stones, unknown date. [facility] 3 benign polyps removed unknown date. Currently a client of Dr [facility]Lay [facility] [place], client states he is being treated for depression, anxiety, Bi-polar and PTSD. Next appt with Dr Geanie CooleyLay is Feb 25, 2016. Last thoughts of suicide per client was 4 years ago, denies any thoughts of suicide presently.  Current medications per client: Diazepam 5mg  being tapered, Metoprolol 50 mg, amlodipine , proventil, Dulera, ASA 81 mg, fish oil, ? Gabapentin, ? Citalopram, ? Clonazepam.   History of Mitral prolapse, last cardiology appt was in 2005. New onset of swelling bilateral feet and legs.History of irregular heart rate, unknown ? PVCs and tachycardia. Positive for murmur.  Also complains of shortness of breath with exertion and climbing stairs. Denies chest pain. Recent complaint of headaches, but has had dental issues in left lower molars and right wisdom teeth.  Client also complains of new onset of increased urination at night as well as increased thirst. Today blood pressure is 140/80 Heart rate regular.  Discussed options for medical care and client wishes to proceed with [facility] [place] [place]. Appointment for first  appointment scheduled for 01/15/16  At 0930 am.  Will follow up as needed. RN contact information given.

## 2016-02-24 ENCOUNTER — Encounter: Payer: Self-pay | Admitting: Physician Assistant

## 2016-02-24 ENCOUNTER — Ambulatory Visit: Payer: Self-pay | Admitting: Physician Assistant

## 2016-02-24 VITALS — BP 150/78 | HR 55 | Temp 98.1°F | Ht 68.0 in | Wt 185.6 lb

## 2016-02-24 DIAGNOSIS — G8929 Other chronic pain: Secondary | ICD-10-CM | POA: Insufficient documentation

## 2016-02-24 DIAGNOSIS — F39 Unspecified mood [affective] disorder: Secondary | ICD-10-CM

## 2016-02-24 DIAGNOSIS — R002 Palpitations: Secondary | ICD-10-CM | POA: Insufficient documentation

## 2016-02-24 DIAGNOSIS — J449 Chronic obstructive pulmonary disease, unspecified: Secondary | ICD-10-CM

## 2016-02-24 DIAGNOSIS — M25511 Pain in right shoulder: Secondary | ICD-10-CM | POA: Insufficient documentation

## 2016-02-24 DIAGNOSIS — R351 Nocturia: Secondary | ICD-10-CM

## 2016-02-24 DIAGNOSIS — F17219 Nicotine dependence, cigarettes, with unspecified nicotine-induced disorders: Secondary | ICD-10-CM

## 2016-02-24 MED ORDER — FINASTERIDE 5 MG PO TABS: 5.0000 mg | ORAL_TABLET | Freq: Every day | ORAL | Status: AC

## 2016-02-24 MED ORDER — DICLOFENAC SODIUM 75 MG PO TBEC: 75.0000 mg | DELAYED_RELEASE_TABLET | Freq: Two times a day (BID) | ORAL | Status: AC

## 2016-02-24 NOTE — Progress Notes (Signed)
BP 168/78 mmHg  Pulse 55  Temp(Src) 98.1 F (36.7 C)  Ht _0  (1.727 m)  Wt 185 lb 9.6 oz (84.188 kg)  BMI 28.23 kg/m2  SpO2 98%   Subjective:    Patient ID: Elberta Spaniel, male    DOB: May 20, 1961, 55 y.o.   MRN: [id number]  HPI: TOMIE ELKO is a 55 y.o. male presenting on 02/24/2016 for Follow-up   HPI  Chief Complaint  Patient presents with  . Follow-up    pt states he had palpatations twice when he was in the facility parking lot     Pt says he has been seen at [facility]. Was told to stop the diazepam and start clonipin   He has f/u there in one month  Pt denies CP.  Pt states he has had palpitations for years.  No recent changes.  He does not use caffeine.  Pt c/o pain R shoulder if lifts to 90 degrees- hurting 2-3 months. No injury  Also c/o nocturia 2-3 /night.  No pain on urination  Relevant past medical, surgical, family and social history reviewed and updated as indicated. Interim medical history since our last visit reviewed. Allergies and medications reviewed and updated.   Current outpatient prescriptions:  .  albuterol (PROVENTIL HFA) 108 (90 Base) MCG/ACT inhaler, Inhale 2 puffs into the lungs every 4 (four) hours as needed for wheezing or shortness of breath. Reported on 02/24/2016, Disp: , Rfl:  .  aspirin 81 MG tablet, Take 81 mg by mouth daily., Disp: , Rfl:  .  diazepam (VALIUM) 5 MG tablet, Take 1 tablet (5 mg total) by mouth every 8 (eight) hours as needed for anxiety., Disp: 90 tablet, Rfl: 0 .  ibuprofen (ADVIL,MOTRIN) 200 MG tablet, Take 800 mg by mouth 3 (three) times daily as needed for headache, mild pain or moderate pain. , Disp: , Rfl:  .  metoprolol (LOPRESSOR) 50 MG tablet, Take 50 mg by mouth 2 (two) times daily., Disp: , Rfl:  .  Omega-3 Fatty Acids (FISH OIL) 1200 MG CAPS, Take 1 capsule by mouth 2 (two) times daily., Disp: , Rfl:  .  mometasone-formoterol (DULERA) 100-5 MCG/ACT AERO, Inhale 2 puffs into the lungs 2 (two)  times daily as needed for wheezing or shortness of breath. Reported on 02/24/2016, Disp: , Rfl:    Review of Systems  Constitutional: Positive for fatigue. Negative for fever, chills, appetite change and unexpected weight change.  HENT: Positive for dental problem, hearing loss and sneezing. Negative for congestion, drooling, ear pain, facial swelling, mouth sores, sore throat, trouble swallowing and voice change.   Eyes: Positive for itching. Negative for pain, discharge, redness and visual disturbance.  Respiratory: Positive for wheezing. Negative for cough, choking and shortness of breath.   Cardiovascular: Positive for palpitations and leg swelling. Negative for chest pain.  Gastrointestinal: Negative for vomiting, abdominal pain, diarrhea, constipation and blood in stool.  Endocrine: Negative for cold intolerance, heat intolerance and polydipsia.  Genitourinary: Negative for dysuria, hematuria and decreased urine volume.  Musculoskeletal: Positive for arthralgias. Negative for back pain and gait problem.  Skin: Negative for rash.  Allergic/Immunologic: Positive for environmental allergies.  Neurological: Negative for seizures, syncope, light-headedness and headaches.  Hematological: Negative for adenopathy.  Psychiatric/Behavioral: Positive for dysphoric mood. Negative for suicidal ideas and agitation. The patient is nervous/anxious.     Per HPI unless specifically indicated above     Objective:    BP 168/78 mmHg  Pulse 55  Temp(Src) 98.1 F (36.7 C)  Ht _0  (1.727 m)  Wt 185 lb 9.6 oz (84.188 kg)  BMI 28.23 kg/m2  SpO2 98%  Wt Readings from Last 3 Encounters:  02/24/16 185 lb 9.6 oz (84.188 kg)  01/31/16 175 lb (79.379 kg)  01/15/16 175 lb 6.4 oz (79.561 kg)   bp recheck 150/78   Physical Exam  Constitutional: He is oriented to person, place, and time. He appears well-developed and well-nourished.  HENT:  Head: Normocephalic and atraumatic.  Neck: Neck supple.   Cardiovascular: Normal rate and regular rhythm.   Pulmonary/Chest: Effort normal and breath sounds normal. He has no wheezes.  Abdominal: Soft. Bowel sounds are normal. There is no hepatosplenomegaly. There is no tenderness.  Musculoskeletal: He exhibits no edema.       Right shoulder: He exhibits pain (with extension past 90 degress and with adduction past 90 degrees). He exhibits normal range of motion, no tenderness, no bony tenderness, no swelling, no effusion, no deformity and normal pulse.       Right elbow: Normal.      Right wrist: Normal.  Lymphadenopathy:    He has no cervical adenopathy.  Neurological: He is alert and oriented to person, place, and time.  Skin: Skin is warm and dry.  Psychiatric: He has a normal mood and affect. His behavior is normal.  Vitals reviewed.   Results for orders placed or performed in visit on 01/15/16  COMPLETE METABOLIC PANEL WITH GFR  Result Value Ref Range   Sodium 141 135 - 146 mmol/L   Potassium 4.6 3.5 - 5.3 mmol/L   Chloride 106 98 - 110 mmol/L   CO2 24 20 - 31 mmol/L   Glucose, Bld 91 65 - 99 mg/dL   BUN 14 7 - 25 mg/dL   Creat 0.90 0.70 - 1.33 mg/dL   Total Bilirubin 0.3 0.2 - 1.2 mg/dL   Alkaline Phosphatase 73 40 - 115 U/L   AST 21 10 - 35 U/L   ALT 30 9 - 46 U/L   Total Protein 5.9 (L) 6.1 - 8.1 g/dL   Albumin 4.0 3.6 - 5.1 g/dL   Calcium 8.4 (L) 8.6 - 10.3 mg/dL   GFR, Est African American >89 >=60 mL/min   GFR, Est Non African American >89 >=60 mL/min  CBC w/Diff/Platelet  Result Value Ref Range   WBC 6.9 3.8 - 10.8 K/uL   RBC 4.41 4.20 - 5.80 MIL/uL   Hemoglobin 13.8 13.2 - 17.1 g/dL   HCT 40.9 38.5 - 50.0 %   MCV 92.7 80.0 - 100.0 fL   MCH 31.3 27.0 - 33.0 pg   MCHC 33.7 32.0 - 36.0 g/dL   RDW 13.6 11.0 - 15.0 %   Platelets 253 140 - 400 K/uL   MPV 9.9 7.5 - 12.5 fL   Neutro Abs 3657 1500 - 7800 cells/uL   Lymphs Abs 2208 850 - 3900 cells/uL   Monocytes Absolute 759 200 - 950 cells/uL   Eosinophils Absolute  276 15 - 500 cells/uL   Basophils Absolute 0 0 - 200 cells/uL   Neutrophils Relative % 53 %   Lymphocytes Relative 32 %   Monocytes Relative 11 %   Eosinophils Relative 4 %   Basophils Relative 0 %   Smear Review Criteria for review not met   TSH  Result Value Ref Range   TSH 1.91 0.40 - 4.50 mIU/L  HgB A1c  Result Value Ref Range   Hgb A1c MFr Bld  5.5 <5.7 %   Mean Plasma Glucose 111 mg/dL  PSA  Result Value Ref Range   PSA 0.34 <=4.00 ng/mL  Sed Rate (ESR)  Result Value Ref Range   Sed Rate 1 0 - 20 mm/hr  Hepatitis, Acute  Result Value Ref Range   Hepatitis B Surface Ag NEGATIVE NEGATIVE   HCV Ab NEGATIVE NEGATIVE   Hep B C IgM NON REACTIVE NON REACTIVE   Hep A IgM NON REACTIVE NON REACTIVE  POCT Glucose (CBG)  Result Value Ref Range   POC Glucose 110 (A) 70 - 99 mg/dl  POCT Urinalysis Dipstick  Result Value Ref Range   Color, UA YELLOW    Clarity, UA CLEAR    Glucose, UA N    Bilirubin, UA N    Ketones, UA N    Spec Grav, UA 1.025    Blood, UA N    pH, UA 5.5    Protein, UA N    Urobilinogen, UA 0.2    Nitrite, UA N    Leukocytes, UA Negative Negative      Assessment & Plan:   Encounter Diagnoses  Name Primary?  . Right shoulder pain Yes  . Cigarette nicotine dependence with nicotine-induced disorder   . Chronic obstructive pulmonary disease, unspecified COPD type ([place])   . Chronic pain   . Nocturia   . Mood disorder ([place])   . Palpitations     -reviewed labs with pt -No prednisone trial for shoulder in light of anxiety. Try diclofenac and icing the shoulder 2-3 times daily -Will need to get lipids drawn at some point (omitted with original lab orders). Pt states lipids have always been good for him in the past (ie no history of hyperlipidemia) -rx proscar for nocturia -Pt to continue with [facility] for bipolar -counseled on smoking cessation to help his breathing -No change in bp meds- has been normal to low prior to today.  Will recheck on  month

## 2016-03-23 ENCOUNTER — Other Ambulatory Visit: Payer: Self-pay | Admitting: Physician Assistant

## 2016-03-23 MED ORDER — METOPROLOL TARTRATE 50 MG PO TABS
50.0000 mg | ORAL_TABLET | Freq: Two times a day (BID) | ORAL | Status: DC
Start: 2016-03-23 — End: 2016-03-26

## 2016-03-26 ENCOUNTER — Encounter: Payer: Self-pay | Admitting: Physician Assistant

## 2016-03-26 ENCOUNTER — Ambulatory Visit: Payer: Self-pay | Admitting: Physician Assistant

## 2016-03-26 ENCOUNTER — Ambulatory Visit (HOSPITAL_COMMUNITY)
Admission: RE | Admit: 2016-03-26 | Discharge: 2016-03-26 | Disposition: A | Discharge status: Home / Self Care | Payer: Self-pay | Source: Ambulatory Visit | Attending: Physician Assistant | Admitting: Physician Assistant

## 2016-03-26 VITALS — BP 136/78 | HR 51 | Temp 97.9°F | Ht 68.0 in | Wt 182.2 lb

## 2016-03-26 DIAGNOSIS — F17219 Nicotine dependence, cigarettes, with unspecified nicotine-induced disorders: Secondary | ICD-10-CM

## 2016-03-26 DIAGNOSIS — M25511 Pain in right shoulder: Secondary | ICD-10-CM | POA: Insufficient documentation

## 2016-03-26 DIAGNOSIS — R002 Palpitations: Secondary | ICD-10-CM

## 2016-03-26 DIAGNOSIS — I10 Essential (primary) hypertension: Secondary | ICD-10-CM

## 2016-03-26 DIAGNOSIS — R351 Nocturia: Secondary | ICD-10-CM

## 2016-03-26 DIAGNOSIS — J449 Chronic obstructive pulmonary disease, unspecified: Secondary | ICD-10-CM

## 2016-03-26 MED ORDER — METOPROLOL TARTRATE 100 MG PO TABS: 100.0000 mg | ORAL_TABLET | Freq: Two times a day (BID) | ORAL | Status: AC

## 2016-03-26 NOTE — Progress Notes (Signed)
BP 136/78 mmHg  Pulse 51  Temp(Src) 97.9 F (36.6 C)  Ht 5' 8"  (1.727 m)  Wt 182 lb 3.2 oz (82.645 kg)  BMI 27.71 kg/m2  SpO2 97%   Subjective:    Patient ID: Lucas Lawrence, male    DOB: 03-15-1961, 55 y.o.   MRN: [id number]  HPI: Lucas Lawrence is a 55 y.o. male presenting on 03/26/2016 for Hypertension and Shoulder Pain   HPI   proscar has decreased his nocturia from 3/n to 1/n  The diclofenac didn't help his shoulder pain  Pt [facility]   Relevant past medical, surgical, family and social history reviewed and updated as indicated. Interim medical history since our last visit reviewed. Allergies and medications reviewed and updated.  Current outpatient prescriptions:  .  albuterol (PROVENTIL HFA) 108 (90 Base) MCG/ACT inhaler, Inhale 2 puffs into the lungs every 4 (four) hours as needed for wheezing or shortness of breath. Reported on 02/24/2016, Disp: , Rfl:  .  aspirin 81 MG tablet, Take 81 mg by mouth daily., Disp: , Rfl:  .  clonazePAM (KLONOPIN) 0.5 MG tablet, Take 0.5 mg by mouth 2 (two) times daily., Disp: , Rfl:  .  diclofenac (VOLTAREN) 75 MG EC tablet, Take 1 tablet (75 mg total) by mouth 2 (two) times daily., Disp: 60 tablet, Rfl: 1 .  finasteride (PROSCAR) 5 MG tablet, Take 1 tablet (5 mg total) by mouth daily., Disp: 30 tablet, Rfl: 1 .  metoprolol (LOPRESSOR) 50 MG tablet, Take 1 tablet (50 mg total) by mouth 2 (two) times daily., Disp: 60 tablet, Rfl: 2 .  mometasone-formoterol (DULERA) 100-5 MCG/ACT AERO, Inhale 2 puffs into the lungs 2 (two) times daily as needed for wheezing or shortness of breath. Reported on 02/24/2016, Disp: , Rfl:  .  Omega-3 Fatty Acids (FISH OIL) 1200 MG CAPS, Take 1 capsule by mouth 2 (two) times daily., Disp: , Rfl:   Review of Systems  Constitutional: Positive for fatigue. Negative for fever, chills, diaphoresis, appetite change and unexpected weight change.  HENT: Positive for dental problem. Negative  for congestion, drooling, ear pain, facial swelling, hearing loss, mouth sores, sneezing, sore throat, trouble swallowing and voice change.   Eyes: Positive for discharge. Negative for pain, redness, itching and visual disturbance.  Respiratory: Negative for cough, choking, shortness of breath and wheezing.   Cardiovascular: Positive for palpitations and leg swelling. Negative for chest pain.  Gastrointestinal: Negative for vomiting, abdominal pain, diarrhea, constipation and blood in stool.  Endocrine: Negative for cold intolerance, heat intolerance and polydipsia.  Genitourinary: Negative for dysuria, hematuria and decreased urine volume.  Musculoskeletal: Positive for arthralgias. Negative for back pain and gait problem.  Skin: Negative for rash.  Allergic/Immunologic: Negative for environmental allergies.  Neurological: Negative for seizures, syncope, light-headedness and headaches.  Hematological: Negative for adenopathy.  Psychiatric/Behavioral: Positive for dysphoric mood and agitation. Negative for suicidal ideas. The patient is nervous/anxious.     Per HPI unless specifically indicated above     Objective:    BP 136/78 mmHg  Pulse 51  Temp(Src) 97.9 F (36.6 C)  Ht 5' 8"  (1.727 m)  Wt 182 lb 3.2 oz (82.645 kg)  BMI 27.71 kg/m2  SpO2 97%  Wt Readings from Last 3 Encounters:  03/26/16 182 lb 3.2 oz (82.645 kg)  02/24/16 185 lb 9.6 oz (84.188 kg)  01/31/16 175 lb (79.379 kg)    Physical Exam  Constitutional: He is oriented to person, place, and  time. He appears well-developed and well-nourished.  HENT:  Head: Normocephalic and atraumatic.  Neck: Neck supple.  Cardiovascular: Normal rate and regular rhythm.   Pulmonary/Chest: Effort normal and breath sounds normal. He has no wheezes.  Abdominal: Soft. Bowel sounds are normal. There is no hepatosplenomegaly. There is no tenderness.  Musculoskeletal: He exhibits no edema.       Right shoulder: He exhibits decreased range  of motion and tenderness. He exhibits no bony tenderness, no swelling and no deformity.  Tender posteriorly.  ROM limited past 90 degrees with adduction due to pain  Lymphadenopathy:    He has no cervical adenopathy.  Neurological: He is alert and oriented to person, place, and time.  Skin: Skin is warm and dry.  Psychiatric: He has a normal mood and affect. His behavior is normal.  Vitals reviewed.    ekg- sinus bradycardia with no st-t changes  Results for orders placed or performed in visit on 01/15/16  COMPLETE METABOLIC PANEL WITH GFR  Result Value Ref Range   Sodium 141 135 - 146 mmol/L   Potassium 4.6 3.5 - 5.3 mmol/L   Chloride 106 98 - 110 mmol/L   CO2 24 20 - 31 mmol/L   Glucose, Bld 91 65 - 99 mg/dL   BUN 14 7 - 25 mg/dL   Creat 0.90 0.70 - 1.33 mg/dL   Total Bilirubin 0.3 0.2 - 1.2 mg/dL   Alkaline Phosphatase 73 40 - 115 U/L   AST 21 10 - 35 U/L   ALT 30 9 - 46 U/L   Total Protein 5.9 (L) 6.1 - 8.1 g/dL   Albumin 4.0 3.6 - 5.1 g/dL   Calcium 8.4 (L) 8.6 - 10.3 mg/dL   GFR, Est African American >89 >=60 mL/min   GFR, Est Non African American >89 >=60 mL/min  CBC w/Diff/Platelet  Result Value Ref Range   WBC 6.9 3.8 - 10.8 K/uL   RBC 4.41 4.20 - 5.80 MIL/uL   Hemoglobin 13.8 13.2 - 17.1 g/dL   HCT 40.9 38.5 - 50.0 %   MCV 92.7 80.0 - 100.0 fL   MCH 31.3 27.0 - 33.0 pg   MCHC 33.7 32.0 - 36.0 g/dL   RDW 13.6 11.0 - 15.0 %   Platelets 253 140 - 400 K/uL   MPV 9.9 7.5 - 12.5 fL   Neutro Abs 3657 1500 - 7800 cells/uL   Lymphs Abs 2208 850 - 3900 cells/uL   Monocytes Absolute 759 200 - 950 cells/uL   Eosinophils Absolute 276 15 - 500 cells/uL   Basophils Absolute 0 0 - 200 cells/uL   Neutrophils Relative % 53 %   Lymphocytes Relative 32 %   Monocytes Relative 11 %   Eosinophils Relative 4 %   Basophils Relative 0 %   Smear Review Criteria for review not met   TSH  Result Value Ref Range   TSH 1.91 0.40 - 4.50 mIU/L  HgB A1c  Result Value Ref Range    Hgb A1c MFr Bld 5.5 <5.7 %   Mean Plasma Glucose 111 mg/dL  PSA  Result Value Ref Range   PSA 0.34 <=4.00 ng/mL  Sed Rate (ESR)  Result Value Ref Range   Sed Rate 1 0 - 20 mm/hr  Hepatitis, Acute  Result Value Ref Range   Hepatitis B Surface Ag NEGATIVE NEGATIVE   HCV Ab NEGATIVE NEGATIVE   Hep B C IgM NON REACTIVE NON REACTIVE   Hep A IgM NON REACTIVE NON REACTIVE  POCT Glucose (CBG)  Result Value Ref Range   POC Glucose 110 (A) 70 - 99 mg/dl  POCT Urinalysis Dipstick  Result Value Ref Range   Color, UA YELLOW    Clarity, UA CLEAR    Glucose, UA N    Bilirubin, UA N    Ketones, UA N    Spec Grav, UA 1.025    Blood, UA N    pH, UA 5.5    Protein, UA N    Urobilinogen, UA 0.2    Nitrite, UA N    Leukocytes, UA Negative Negative      Assessment & Plan:   Encounter Diagnoses  Name Primary?  . Right shoulder pain Yes  . Palpitations   . Essential hypertension, benign   . Nocturia   . Chronic obstructive pulmonary disease, unspecified COPD type ([place])   . Cigarette nicotine dependence with nicotine-induced disorder     -Increase metoprolol for palpitations and htn -Xray shoulder -[facility] discount applicaiton given- will refer to ortho/cards at next appointmentt if symptoms not improved -Continue proscar for nocturia -F/u 1 month

## 2016-03-27 DIAGNOSIS — I10 Essential (primary) hypertension: Secondary | ICD-10-CM | POA: Insufficient documentation

## 2016-04-23 ENCOUNTER — Ambulatory Visit: Payer: Self-pay | Admitting: Physician Assistant

## 2016-05-14 ENCOUNTER — Encounter: Payer: Self-pay | Admitting: Physician Assistant

## 2017-04-17 IMAGING — DX DG SHOULDER 2+V*R*
3 series · 3 of 3 positions shown · non-contrast
Comparison: None.

CLINICAL DATA: Right shoulder pain for 2 months.  No known injury.

EXAM:
RIGHT SHOULDER - 2+ VIEW

[shoulder ap]
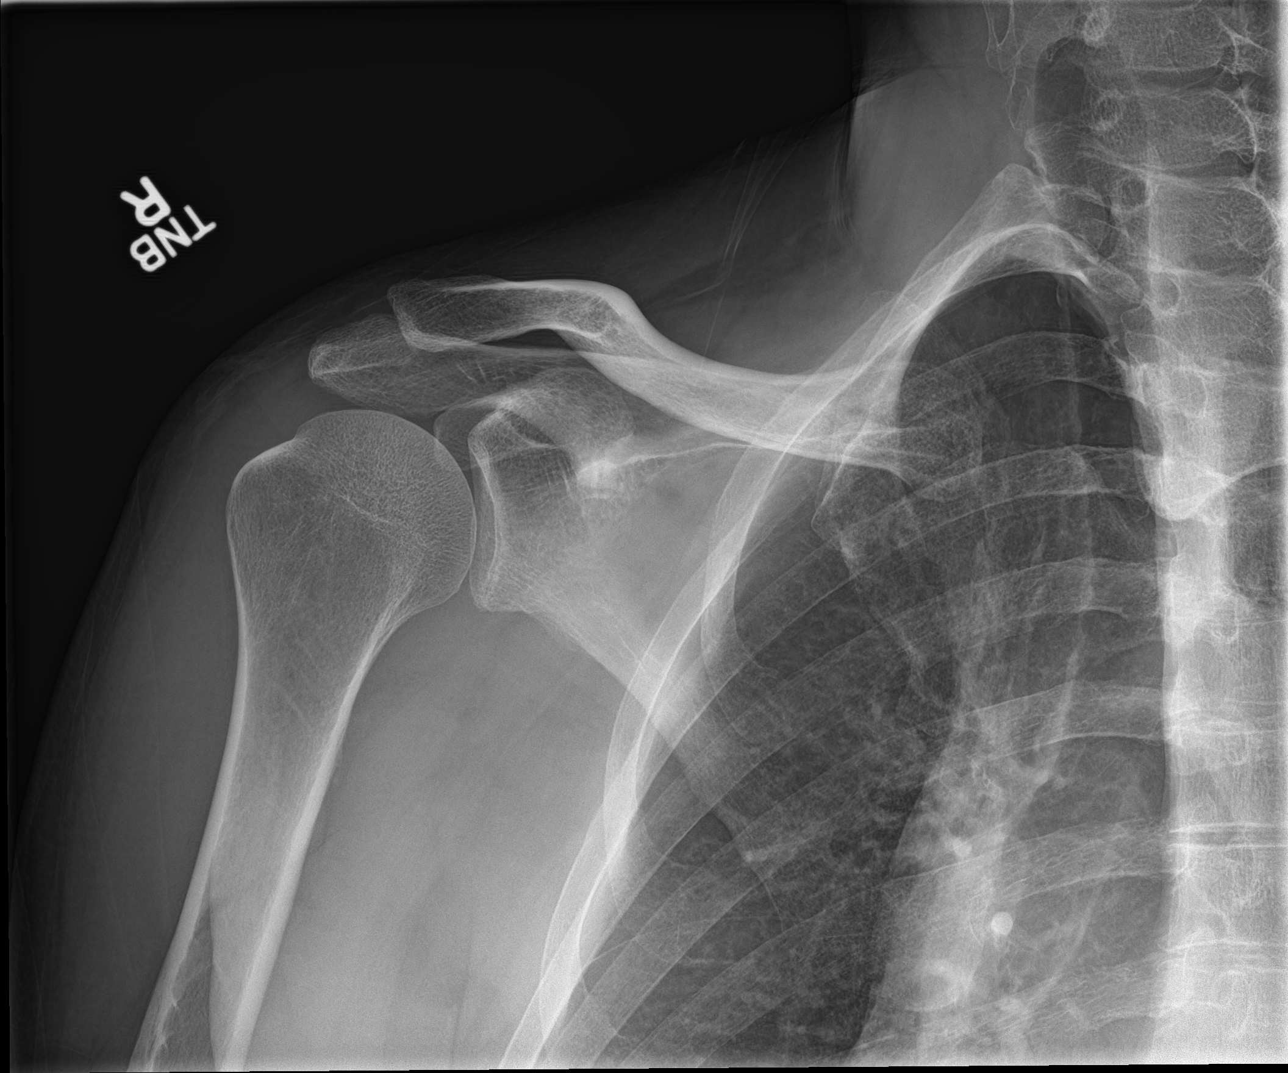

[shoulder y view]
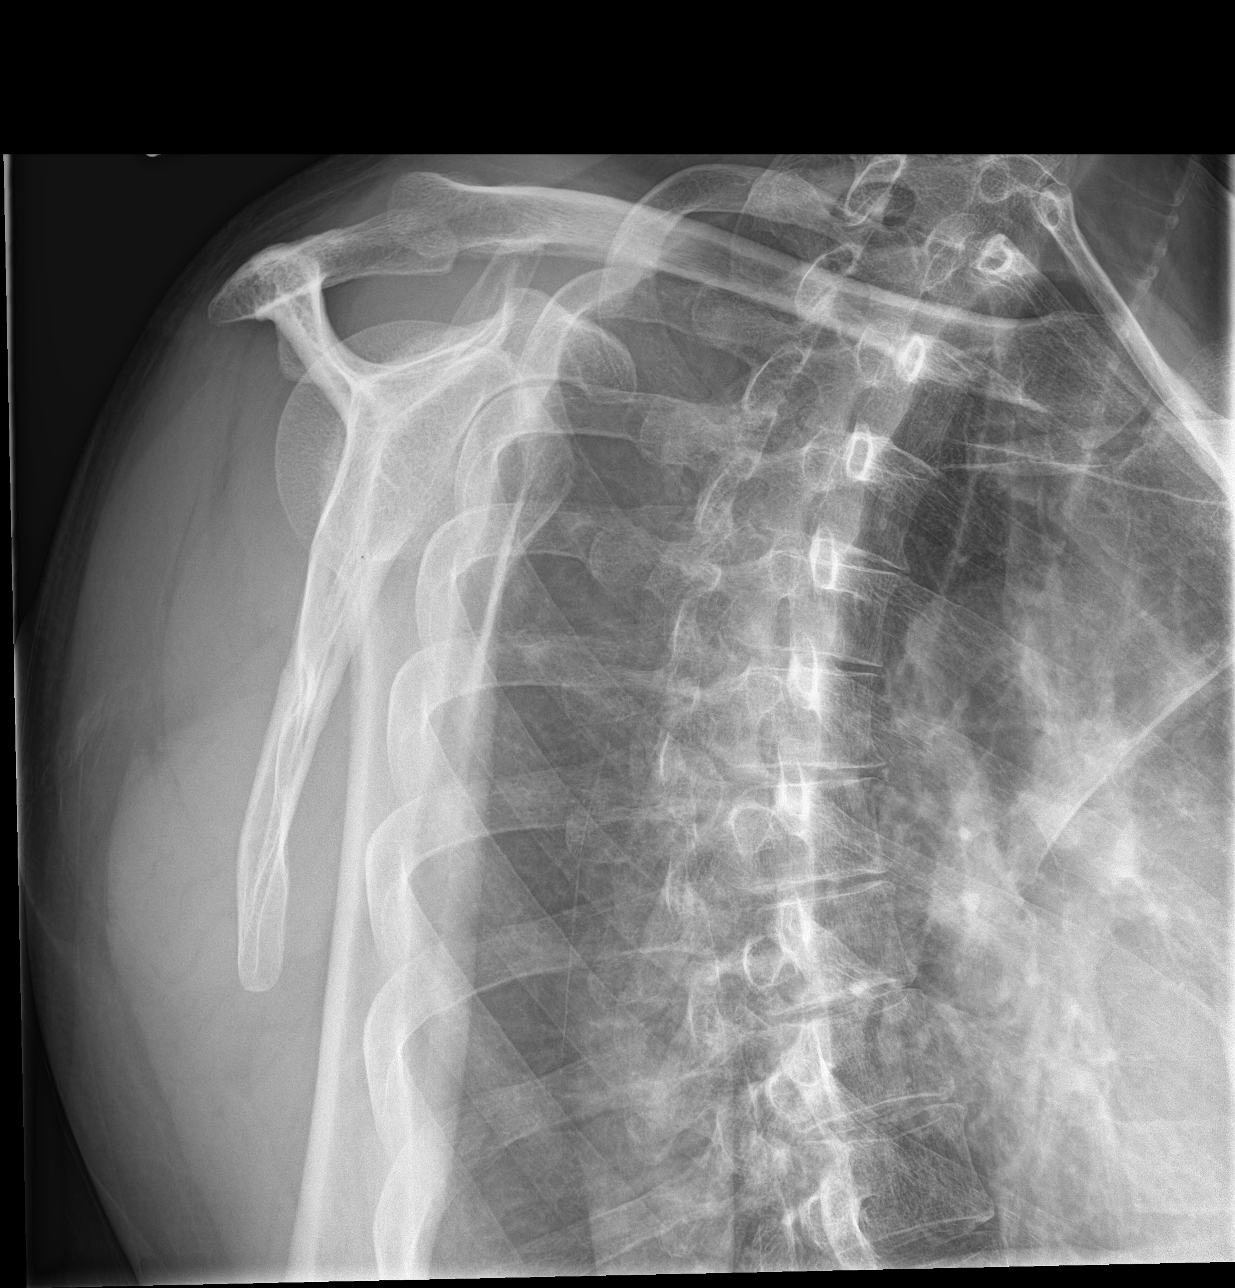

[shoulder axillary]
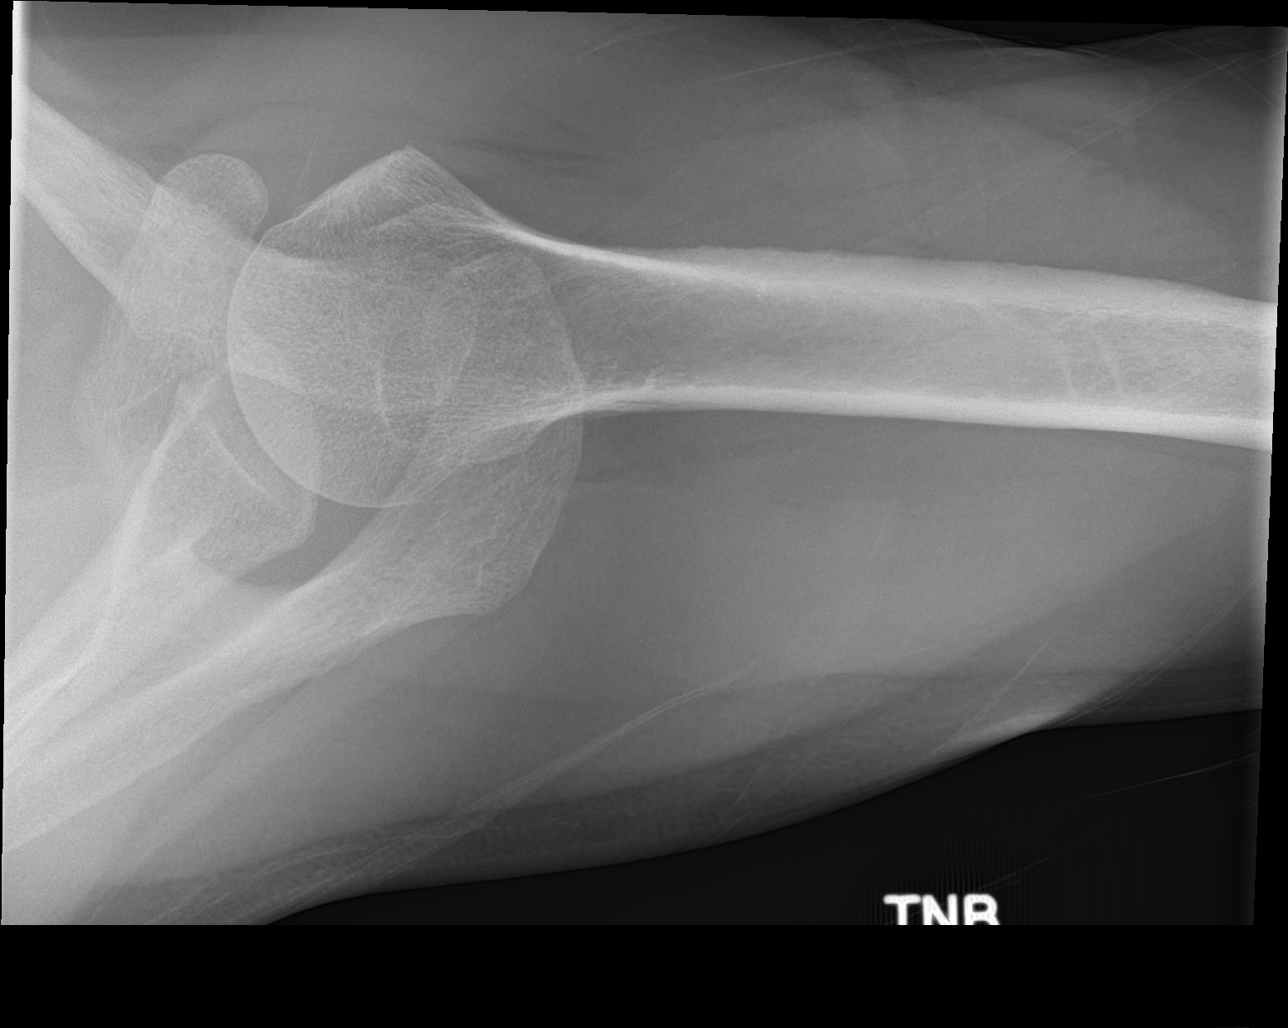

[3 of 3 positions shown; findings below may reference images not displayed]

FINDINGS: There is no evidence of fracture or dislocation. There is no
evidence of arthropathy or other focal bone abnormality. Soft
tissues are unremarkable.
IMPRESSION: Negative.

## 2020-11-24 ENCOUNTER — Encounter (HOSPITAL_COMMUNITY): Payer: Self-pay | Admitting: *Deleted

## 2020-11-24 ENCOUNTER — Other Ambulatory Visit: Payer: Self-pay

## 2020-11-24 ENCOUNTER — Emergency Department (HOSPITAL_COMMUNITY)
Admission: EM | Admit: 2020-11-24 | Discharge: 2020-11-24 | Disposition: A | Discharge status: Home / Self Care | Payer: Self-pay | Attending: Emergency Medicine | Admitting: Emergency Medicine

## 2020-11-24 DIAGNOSIS — I10 Essential (primary) hypertension: Secondary | ICD-10-CM | POA: Insufficient documentation

## 2020-11-24 DIAGNOSIS — F17210 Nicotine dependence, cigarettes, uncomplicated: Secondary | ICD-10-CM | POA: Insufficient documentation

## 2020-11-24 DIAGNOSIS — J449 Chronic obstructive pulmonary disease, unspecified: Secondary | ICD-10-CM | POA: Insufficient documentation

## 2020-11-24 DIAGNOSIS — K047 Periapical abscess without sinus: Secondary | ICD-10-CM | POA: Insufficient documentation

## 2020-11-24 DIAGNOSIS — Z79899 Other long term (current) drug therapy: Secondary | ICD-10-CM | POA: Insufficient documentation

## 2020-11-24 DIAGNOSIS — Z7982 Long term (current) use of aspirin: Secondary | ICD-10-CM | POA: Insufficient documentation

## 2020-11-24 MED ORDER — PENICILLIN V POTASSIUM 500 MG PO TABS
500.0000 mg | ORAL_TABLET | Freq: Four times a day (QID) | ORAL | 0 refills | Status: AC
Start: 2020-11-24 — End: 2020-12-04

## 2020-11-24 MED ORDER — TRAMADOL HCL 50 MG PO TABS: 50.0000 mg | ORAL_TABLET | Freq: Four times a day (QID) | ORAL | 0 refills | Status: AC | PRN

## 2020-11-24 MED ORDER — PENICILLIN V POTASSIUM 250 MG PO TABS
500.0000 mg | ORAL_TABLET | Freq: Once | ORAL | Status: AC
  Administered 2020-11-24: 500 mg via ORAL
  Filled 2020-11-24: qty 2

## 2020-11-24 NOTE — Discharge Instructions (Addendum)
Complete your entire course of antibiotics as prescribed.  You  may use the tramadol for pain relief but do not drive within 4 hours of taking as this will make you drowsy. Continue using your ibuprofen. Avoid applying heat or ice to this abscess area which can worsen your symptoms.  You may use warm salt water swish and spit treatment or half peroxide and water swish and spit after meals to keep this area clean as discussed.  Call the dentist listed above for further management of your symptoms, or the dental clinic in [place] that you mention.

## 2020-11-24 NOTE — ED Triage Notes (Signed)
Dental pain to upper right.  Pt states he has an abscess.  States with swelling x 1 week ago. Denies any fevers.

## 2020-11-24 NOTE — ED Provider Notes (Signed)
[facility] EMERGENCY DEPARTMENT Provider Note   CSN: [id number] Arrival date & time: 11/24/20  1249     History Chief Complaint  Patient presents with  . Abscess    Lucas Lawrence is a 60 y.o. male with a history as outlined below who endorses he is currently homeless living with friends here locally but planning to return to his home in [place] where his primary care including dental follow-up is available, presenting with a 1 week history of dental pain and swelling around his right lower molar tooth.  He describes constant throbbing pain which is only mildly improved with Tylenol and ibuprofen.  He denies fevers or chills, difficulty swallowing.  He does endorse pain with full attempts at opening his mouth, denies facial swelling.  He has found no alleviators for symptoms.  HPI     Past Medical History:  Diagnosis Date  . Anxiety   . Bipolar disorder (HCC)   . COPD (chronic obstructive pulmonary disease) (HCC)   . Depression   . Heart palpitations   . Hypertension   . Mitral valve prolapse 2005  . PTSD (post-traumatic stress disorder)   . Substance abuse (HCC)   . Tinnitus Bilateral    Patient Active Problem List   Diagnosis Date Noted  . Essential hypertension, benign 03/27/2016  . Chronic obstructive pulmonary disease (HCC) 02/24/2016  . Mood disorder (HCC) 02/24/2016  . Chronic pain 02/24/2016  . Right shoulder pain 02/24/2016  . Palpitations 02/24/2016  . Nocturia 01/15/2016  . Arthralgia of multiple sites 01/15/2016  . History of alcohol abuse 01/15/2016  . Cigarette nicotine dependence with nicotine-induced disorder 01/15/2016  . Bipolar affective disorder, current episode mixed (HCC) 01/15/2016    Past Surgical History:  Procedure Laterality Date  . SALIVARY GLAND SURGERY Left    Salivary Gland removal       Family History  Problem Relation Age of Onset  . Mental illness Mother        Bipolar  . Dementia Mother   . COPD Mother   . COPD  Father   . Lung disease Father     Social History   Tobacco Use  . Smoking status: Current Every Day Smoker    Packs/day: 1.00    Years: 30.00    Pack years: 30.00    Types: Cigarettes  . Smokeless tobacco: Former Neurosurgeon    Types: Chew  Substance Use Topics  . Alcohol use: No    Alcohol/week: 32.0 standard drinks    Types: 32 Cans of beer per week    Comment: alcoholic. dry since 12/2015  . Drug use: No    Types: Marijuana    Comment: none since about age 60    Home Medications Prior to Admission medications   Medication Sig Start Date End Date Taking? Authorizing Provider  penicillin v potassium (VEETID) 500 MG tablet Take 1 tablet (500 mg total) by mouth 4 (four) times daily for 10 days. 11/24/20 12/04/20 Yes Jasman Murri, Raynelle Fanning, PA-C  traMADol (ULTRAM) 50 MG tablet Take 1 tablet (50 mg total) by mouth every 6 (six) hours as needed. 11/24/20  Yes Nashae Maudlin, Raynelle Fanning, PA-C  albuterol (PROVENTIL HFA) 108 (90 Base) MCG/ACT inhaler Inhale 2 puffs into the lungs every 4 (four) hours as needed for wheezing or shortness of breath. Reported on 02/24/2016    [provider]  aspirin 81 MG tablet Take 81 mg by mouth daily.    [provider]  clonazePAM (KLONOPIN) 0.5 MG tablet Take 0.5  mg by mouth 2 (two) times daily.    [provider]  diclofenac (VOLTAREN) 75 MG EC tablet Take 1 tablet (75 mg total) by mouth 2 (two) times daily. 02/24/16   Jacquelin Hawking, PA-C  finasteride (PROSCAR) 5 MG tablet Take 1 tablet (5 mg total) by mouth daily. 02/24/16   Jacquelin Hawking, PA-C  metoprolol (LOPRESSOR) 100 MG tablet Take 1 tablet (100 mg total) by mouth 2 (two) times daily. 03/26/16   Jacquelin Hawking, PA-C  mometasone-formoterol (DULERA) 100-5 MCG/ACT AERO Inhale 2 puffs into the lungs 2 (two) times daily as needed for wheezing or shortness of breath. Reported on 02/24/2016    [provider]  Omega-3 Fatty Acids (FISH OIL) 1200 MG CAPS Take 1 capsule by mouth 2 (two) times daily.     [provider]    Allergies    Patient has no known allergies.  Review of Systems   Review of Systems  Constitutional: Negative for chills and fever.  HENT: Positive for dental problem. Negative for facial swelling and sore throat.   Respiratory: Negative for shortness of breath.   Musculoskeletal: Negative for neck pain and neck stiffness.  All other systems reviewed and are negative.   Physical Exam Updated Vital Signs BP (!) 171/73 (BP Location: Right Arm)   Pulse 84   Temp 98.6 F (37 C) (Oral)   Resp 16   Ht 5\' 9"  (1.753 m)   Wt 72.6 kg   SpO2 97%   BMI 23.63 kg/m   Physical Exam Constitutional:      General: He is not in acute distress.    Appearance: He is well-developed and well-nourished.  HENT:     Head: Normocephalic and atraumatic.     Jaw: Pain on movement present. No trismus or swelling.     Right Ear: Tympanic membrane and external ear normal.     Left Ear: Tympanic membrane and external ear normal.     Mouth/Throat:     Mouth: Oropharynx is clear and moist and mucous membranes are normal. No oral lesions.     Dentition: Abnormal dentition. Dental abscesses present.     Comments: Right lower second molar with moderate gingival edema surrounding this tooth.  There is no obvious cavity, but suspect there is an apical abscess at the site.  He has no buccal mucosal edema, no facial erythema or cellulitis.  No tracking induration.  Sublingual space is soft. Eyes:     Conjunctiva/sclera: Conjunctivae normal.  Cardiovascular:     Rate and Rhythm: Normal rate.     Heart sounds: Normal heart sounds.  Pulmonary:     Effort: Pulmonary effort is normal.  Abdominal:     General: There is no distension.  Musculoskeletal:        General: Normal range of motion.     Cervical back: Normal range of motion and neck supple.  Lymphadenopathy:     Head:     Right side of head: Submandibular adenopathy present.     Cervical: No cervical adenopathy.   Skin:    General: Skin is warm and dry.     Findings: No erythema.  Neurological:     Mental Status: He is alert and oriented to person, place, and time.  Psychiatric:        Mood and Affect: Mood and affect normal.     ED Results / Procedures / Treatments   Labs (all labs ordered are listed, but only abnormal results are displayed) Labs Reviewed -  No data to display  EKG None  Radiology No results found.  Procedures Procedures   Medications Ordered in ED Medications  penicillin v potassium (VEETID) tablet 500 mg (has no administration in time range)    ED Course  I have reviewed the triage vital signs and the nursing notes.  Pertinent labs & imaging results that were available during my care of the patient were reviewed by me and considered in my medical decision making (see chart for details).    MDM Rules/Calculators/A&P                          Patient with localizing dental infection/abscess with no facial involvement.  He has no significant trismus, no sublingual edema or induration, exam does not suggest Ludwick's angina.  He was started on penicillin, also prescribed a few tramadol tablets for the next 2 days after which he can control his pain with his ibuprofen.  He was given local dental referrals in the event he ends up staying here rather than returning to [place] which he plans to do next week. Final Clinical Impression(s) / ED Diagnoses Final diagnoses:  Dental abscess    Rx / DC Orders ED Discharge Orders         Ordered    penicillin v potassium (VEETID) 500 MG tablet  4 times daily        11/24/20 1353    traMADol (ULTRAM) 50 MG tablet  Every 6 hours PRN        11/24/20 1353           Burgess Amor, PA-C 11/24/20 1402    Horton, Clabe Seal, DO 11/25/20 [phone]

## 2020-11-30 ENCOUNTER — Other Ambulatory Visit: Payer: Self-pay

## 2020-11-30 ENCOUNTER — Encounter (HOSPITAL_COMMUNITY): Payer: Self-pay | Admitting: *Deleted

## 2020-11-30 ENCOUNTER — Emergency Department (HOSPITAL_COMMUNITY)
Admission: EM | Admit: 2020-11-30 | Discharge: 2020-11-30 | Disposition: A | Discharge status: Home / Self Care | Payer: Self-pay | Attending: Emergency Medicine | Admitting: Emergency Medicine

## 2020-11-30 DIAGNOSIS — K047 Periapical abscess without sinus: Secondary | ICD-10-CM | POA: Insufficient documentation

## 2020-11-30 DIAGNOSIS — F17210 Nicotine dependence, cigarettes, uncomplicated: Secondary | ICD-10-CM | POA: Insufficient documentation

## 2020-11-30 DIAGNOSIS — Z7982 Long term (current) use of aspirin: Secondary | ICD-10-CM | POA: Insufficient documentation

## 2020-11-30 DIAGNOSIS — J449 Chronic obstructive pulmonary disease, unspecified: Secondary | ICD-10-CM | POA: Insufficient documentation

## 2020-11-30 DIAGNOSIS — K029 Dental caries, unspecified: Secondary | ICD-10-CM | POA: Insufficient documentation

## 2020-11-30 DIAGNOSIS — I10 Essential (primary) hypertension: Secondary | ICD-10-CM | POA: Insufficient documentation

## 2020-11-30 DIAGNOSIS — Z79899 Other long term (current) drug therapy: Secondary | ICD-10-CM | POA: Insufficient documentation

## 2020-11-30 MED ORDER — CLINDAMYCIN HCL 150 MG PO CAPS
300.0000 mg | ORAL_CAPSULE | Freq: Three times a day (TID) | ORAL | 0 refills | Status: AC
Start: 2020-11-30 — End: 2020-12-10

## 2020-11-30 MED ORDER — CLINDAMYCIN PHOSPHATE 600 MG/50ML IV SOLN
600.0000 mg | Freq: Once | INTRAVENOUS | Status: AC
Start: 2020-11-30 — End: 2020-11-30
  Administered 2020-11-30: 600 mg via INTRAVENOUS
  Filled 2020-11-30: qty 50

## 2020-11-30 MED ORDER — HYDROMORPHONE HCL 1 MG/ML IJ SOLN
1.0000 mg | Freq: Once | INTRAMUSCULAR | Status: AC
  Administered 2020-11-30: 1 mg via INTRAVENOUS
  Filled 2020-11-30: qty 1

## 2020-11-30 MED ORDER — SODIUM CHLORIDE 0.9 % IV BOLUS
500.0000 mL | Freq: Once | INTRAVENOUS | Status: AC
  Administered 2020-11-30: 500 mL via INTRAVENOUS

## 2020-11-30 MED ORDER — OXYCODONE-ACETAMINOPHEN 5-325 MG PO TABS: 1.0000 | ORAL_TABLET | Freq: Three times a day (TID) | ORAL | 0 refills | Status: AC | PRN

## 2020-11-30 NOTE — Discharge Instructions (Addendum)
Like we discussed, I am prescribing you a new antibiotic called clindamycin.  You can discontinue taking amoxicillin.  You can take this new antibiotic 3 times a day for the next 10 days.  Do not stop taking this antibiotic early even if you find that your symptoms have began improving.  We will also prescribe you a few additional Percocet for breakthrough pain.  Do not mix these with alcohol.  Please make sure that if you are taking Tylenol as well, and that you do not exceed more than 3000 mg of Tylenol in 1 day.  This medication can be constipating so please be sure to stay hydrated.  Do not operate a motor vehicle after taking it.  Only take it as prescribed.  Please continue to follow-up with local dental providers.  If you develop worsening pain you cannot control, fevers, chills, shortness of breath, drooling, difficulty swallowing, please return to the emergency department for reevaluation.  It was a pleasure to meet you.

## 2020-11-30 NOTE — ED Provider Notes (Signed)
[facility] EMERGENCY DEPARTMENT Provider Note   CSN: [id number] Arrival date & time: 11/30/20  1341     History Chief Complaint  Patient presents with  . Abscess    Lucas Lawrence is a 60 y.o. male.  HPI   Patient is a 60 year old male with a medical history as noted below.  Presents the emergency department due to right lower dental pain and swelling.  Patient was seen 6 days ago with similar symptoms and discharged in stable condition with a prescription for amoxicillin as well as dental follow-up.  He states that his symptoms have continued to worsen.  Reports significant pain, swelling and trismus.  States that he followed up with the local dental providers but they stated that they could not intervene until his infection had subsided.  Denies any fevers, chills, sore throat, n/v, difficulty swallowing, chest pain, shortness of breath.     Past Medical History:  Diagnosis Date  . Anxiety   . Bipolar disorder (HCC)   . COPD (chronic obstructive pulmonary disease) (HCC)   . Depression   . Heart palpitations   . Hypertension   . Mitral valve prolapse 2005  . PTSD (post-traumatic stress disorder)   . Substance abuse (HCC)   . Tinnitus Bilateral    Patient Active Problem List   Diagnosis Date Noted  . Essential hypertension, benign 03/27/2016  . Chronic obstructive pulmonary disease (HCC) 02/24/2016  . Mood disorder (HCC) 02/24/2016  . Chronic pain 02/24/2016  . Right shoulder pain 02/24/2016  . Palpitations 02/24/2016  . Nocturia 01/15/2016  . Arthralgia of multiple sites 01/15/2016  . History of alcohol abuse 01/15/2016  . Cigarette nicotine dependence with nicotine-induced disorder 01/15/2016  . Bipolar affective disorder, current episode mixed (HCC) 01/15/2016    Past Surgical History:  Procedure Laterality Date  . SALIVARY GLAND SURGERY Left    Salivary Gland removal       Family History  Problem Relation Age of Onset  . Mental illness Mother         Bipolar  . Dementia Mother   . COPD Mother   . COPD Father   . Lung disease Father     Social History   Tobacco Use  . Smoking status: Current Every Day Smoker    Packs/day: 1.00    Years: 30.00    Pack years: 30.00    Types: Cigarettes  . Smokeless tobacco: Former Neurosurgeon    Types: Chew  Substance Use Topics  . Alcohol use: No    Alcohol/week: 32.0 standard drinks    Types: 32 Cans of beer per week    Comment: alcoholic. dry since 12/2015  . Drug use: No    Types: Marijuana    Comment: none since about age 9    Home Medications Prior to Admission medications   Medication Sig Start Date End Date Taking? Authorizing Provider  clindamycin (CLEOCIN) 150 MG capsule Take 2 capsules (300 mg total) by mouth 3 (three) times daily for 10 days. 11/30/20 12/10/20 Yes Placido Sou, PA-C  oxyCODONE-acetaminophen (PERCOCET/ROXICET) 5-325 MG tablet Take 1 tablet by mouth every 8 (eight) hours as needed for severe pain. 11/30/20  Yes Placido Sou, PA-C  albuterol (PROVENTIL HFA) 108 (90 Base) MCG/ACT inhaler Inhale 2 puffs into the lungs every 4 (four) hours as needed for wheezing or shortness of breath. Reported on 02/24/2016    [provider]  aspirin 81 MG tablet Take 81 mg by mouth daily.    [provider]  clonazePAM (KLONOPIN) 0.5 MG tablet Take 0.5 mg by mouth 2 (two) times daily.    [provider]  diclofenac (VOLTAREN) 75 MG EC tablet Take 1 tablet (75 mg total) by mouth 2 (two) times daily. 02/24/16   Jacquelin Hawking, PA-C  finasteride (PROSCAR) 5 MG tablet Take 1 tablet (5 mg total) by mouth daily. 02/24/16   Jacquelin Hawking, PA-C  metoprolol (LOPRESSOR) 100 MG tablet Take 1 tablet (100 mg total) by mouth 2 (two) times daily. 03/26/16   Jacquelin Hawking, PA-C  mometasone-formoterol (DULERA) 100-5 MCG/ACT AERO Inhale 2 puffs into the lungs 2 (two) times daily as needed for wheezing or shortness of breath. Reported on 02/24/2016    [provider]  Omega-3 Fatty Acids (FISH OIL) 1200 MG CAPS Take 1 capsule by mouth 2 (two) times daily.    [provider]  penicillin v potassium (VEETID) 500 MG tablet Take 1 tablet (500 mg total) by mouth 4 (four) times daily for 10 days. 11/24/20 12/04/20  Burgess Amor, PA-C  traMADol (ULTRAM) 50 MG tablet Take 1 tablet (50 mg total) by mouth every 6 (six) hours as needed. 11/24/20   Burgess Amor, PA-C    Allergies    Patient has no known allergies.  Review of Systems   Review of Systems  All other systems reviewed and are negative. Ten systems reviewed and are negative for acute change, except as noted in the HPI.   Physical Exam Updated Vital Signs BP (!) 161/102   Pulse 66   Temp 98.2 F (36.8 C) (Oral)   Resp 20   Ht 5\' 7"  (1.702 m)   Wt 70.3 kg   SpO2 96%   BMI 24.28 kg/m   Physical Exam Vitals and nursing note reviewed.  Constitutional:      General: He is not in acute distress.    Appearance: Normal appearance. He is not ill-appearing, toxic-appearing or diaphoretic.     Comments: Well-developed adult male.  Speaking clearly and coherently.  Appears to be in pain.  HENT:     Head: Normocephalic and atraumatic.     Right Ear: External ear normal.     Left Ear: External ear normal.     Nose: Nose normal.     Mouth/Throat:     Mouth: Mucous membranes are moist.     Pharynx: Oropharynx is clear. Posterior oropharyngeal erythema present. No oropharyngeal exudate.     Comments: Extremely poor dentition.  Multiple dental caries noted.  Multiple extracted molars in the right lower mouth.  Angulated right lower wisdom tooth with plaque buildup and dental caries.  Moderate pain with manipulation of the tooth.  Surrounding fluctuance and erythema.  2 finger trismus.  Additional exquisite pain noted to the right submandibular region.  Submental and sublingual space is soft and nontender.  Readily handling secretions.  Uvula midline.  No hot potato voice. Eyes:     General: No scleral  icterus.       Right eye: No discharge.        Left eye: No discharge.     Extraocular Movements: Extraocular movements intact.     Conjunctiva/sclera: Conjunctivae normal.  Cardiovascular:     Rate and Rhythm: Normal rate and regular rhythm.     Pulses: Normal pulses.     Heart sounds: Normal heart sounds. No murmur heard. No friction rub. No gallop.   Pulmonary:     Effort: Pulmonary effort is normal. No respiratory distress.     Breath  sounds: Normal breath sounds. No stridor. No wheezing, rhonchi or rales.     Comments: No stridor.  Speaking in clear and complete sentences.  No respiratory distress.  Lungs are clear to auscultation bilaterally. Abdominal:     General: Abdomen is flat.     Tenderness: There is no abdominal tenderness.  Musculoskeletal:        General: Normal range of motion.     Cervical back: Normal range of motion and neck supple. No tenderness.  Skin:    General: Skin is warm and dry.  Neurological:     General: No focal deficit present.     Mental Status: He is alert and oriented to person, place, and time.  Psychiatric:        Mood and Affect: Mood normal.        Behavior: Behavior normal.    ED Results / Procedures / Treatments   Labs (all labs ordered are listed, but only abnormal results are displayed) Labs Reviewed - No data to display  EKG None  Radiology No results found.  Procedures Procedures   Medications Ordered in ED Medications  sodium chloride 0.9 % bolus 500 mL (0 mLs Intravenous Stopped 11/30/20 1527)  HYDROmorphone (DILAUDID) injection 1 mg (1 mg Intravenous Given 11/30/20 1421)  clindamycin (CLEOCIN) IVPB 600 mg (0 mg Intravenous Stopped 11/30/20 1527)    ED Course  I have reviewed the triage vital signs and the nursing notes.  Pertinent labs & imaging results that were available during my care of the patient were reviewed by me and considered in my medical decision making (see chart for details).    MDM  Rules/Calculators/A&P                          Patient returns the emergency department today for a right lower dental infection.  Initially seen 6 days ago and started on amoxicillin as well as given dental follow-up.  He has been taking this with worsening symptoms.  States that he followed up with the local dental providers but they will not see him until his infection is resolved.  Submental and sublingual space is soft and nontender.  Exam nonconcerning for Ludwig's angina at this time.  Uvula is midline.  Exam nonconcerning for PTA at this time. Speaking clearly and coherently. No respiratory distress noted. No stridor. LCTAB.   Given patient's treatment failure, I started him on IV clindamycin in the emergency department, IV fluids, as well as a dose of IV Dilaudid.  Will discharge on clindamycin 3 times daily for the next 10 days.  We discussed return precautions in length and he knows he needs to return to the emergency department with any fevers, chills, difficulty swallowing, pain that cannot be controlled or SOB.  Recommended once again that he follow-up with local dental providers.  Will provide patient with a very short course of Percocet for breakthrough pain.  Discussed safety regarding this medication. His questions were answered and he was amicable at the time of discharge.  Final Clinical Impression(s) / ED Diagnoses Final diagnoses:  Dental abscess   Rx / DC Orders ED Discharge Orders         Ordered    clindamycin (CLEOCIN) 150 MG capsule  3 times daily        11/30/20 1528    oxyCODONE-acetaminophen (PERCOCET/ROXICET) 5-325 MG tablet  Every 8 hours PRN        11/30/20 1528  Placido SouJoldersma, Rayan Ines, PA-C 11/30/20 1533    Bethann BerkshireZammit, Joseph, MD 12/02/20 [phone]

## 2020-11-30 NOTE — ED Triage Notes (Signed)
Dental pain and swelling, seen last week for same, denies improvement
